# Patient Record
Sex: Female | Born: 1980 | Race: Black or African American | Hispanic: No | Marital: Single
Health system: Southern US, Community
[De-identification: ages and names within clinical notes are randomized; demographics above are authoritative.]

---

## 2014-03-26 ENCOUNTER — Inpatient Hospital Stay: Payer: Self-pay | Admitting: Surgery

## 2014-03-26 LAB — CBC
HCT: 45 % (ref 35.0–47.0)
HGB: 14.3 g/dL (ref 12.0–16.0)
MCH: 27.9 pg (ref 26.0–34.0)
MCHC: 31.8 g/dL — ABNORMAL LOW (ref 32.0–36.0)
MCV: 88 fL (ref 80–100)
PLATELETS: 405 10*3/uL (ref 150–440)
RBC: 5.13 10*6/uL (ref 3.80–5.20)
RDW: 13.7 % (ref 11.5–14.5)
WBC: 12.1 10*3/uL — ABNORMAL HIGH (ref 3.6–11.0)

## 2014-03-26 LAB — URINALYSIS, COMPLETE
BILIRUBIN, UR: NEGATIVE
Bacteria: NONE SEEN
GLUCOSE, UR: NEGATIVE mg/dL (ref 0–75)
KETONE: NEGATIVE
Leukocyte Esterase: NEGATIVE
Nitrite: NEGATIVE
PH: 8 (ref 4.5–8.0)
Protein: NEGATIVE
Specific Gravity: 1.003 (ref 1.003–1.030)
WBC UR: 2 /HPF (ref 0–5)

## 2014-03-26 LAB — COMPREHENSIVE METABOLIC PANEL
AST: 781 U/L — AB (ref 15–37)
Albumin: 3.8 g/dL (ref 3.4–5.0)
Alkaline Phosphatase: 165 U/L — ABNORMAL HIGH
Anion Gap: 5 — ABNORMAL LOW (ref 7–16)
BILIRUBIN TOTAL: 2.7 mg/dL — AB (ref 0.2–1.0)
BUN: 6 mg/dL — AB (ref 7–18)
CALCIUM: 9.4 mg/dL (ref 8.5–10.1)
CHLORIDE: 103 mmol/L (ref 98–107)
CO2: 26 mmol/L (ref 21–32)
Creatinine: 0.85 mg/dL (ref 0.60–1.30)
EGFR (African American): >60
EGFR (Non-African Amer.): >60
GLUCOSE: 138 mg/dL — AB (ref 65–99)
Osmolality: 268 (ref 275–301)
Potassium: 4.3 mmol/L (ref 3.5–5.1)
SGPT (ALT): 828 U/L — ABNORMAL HIGH (ref 12–78)
Sodium: 134 mmol/L — ABNORMAL LOW (ref 136–145)
Total Protein: 8.2 g/dL (ref 6.4–8.2)

## 2014-03-26 LAB — LIPASE, BLOOD

## 2014-03-27 LAB — COMPREHENSIVE METABOLIC PANEL
ALBUMIN: 2.9 g/dL — AB (ref 3.4–5.0)
ANION GAP: 6 — AB (ref 7–16)
Alkaline Phosphatase: 136 U/L — ABNORMAL HIGH
BUN: 7 mg/dL (ref 7–18)
Bilirubin,Total: 0.9 mg/dL (ref 0.2–1.0)
Calcium, Total: 7.9 mg/dL — ABNORMAL LOW (ref 8.5–10.1)
Chloride: 109 mmol/L — ABNORMAL HIGH (ref 98–107)
Co2: 26 mmol/L (ref 21–32)
Creatinine: 0.78 mg/dL (ref 0.60–1.30)
EGFR (African American): >60
EGFR (Non-African Amer.): >60
Glucose: 86 mg/dL (ref 65–99)
OSMOLALITY: 279 (ref 275–301)
Potassium: 3.7 mmol/L (ref 3.5–5.1)
SGOT(AST): 235 U/L — ABNORMAL HIGH (ref 15–37)
SGPT (ALT): 522 U/L — ABNORMAL HIGH (ref 12–78)
Sodium: 141 mmol/L (ref 136–145)
Total Protein: 6.4 g/dL (ref 6.4–8.2)

## 2014-03-27 LAB — CBC WITH DIFFERENTIAL/PLATELET
Basophil #: 0 10*3/uL (ref 0.0–0.1)
Basophil %: 0.3 %
Eosinophil #: 0.2 10*3/uL (ref 0.0–0.7)
Eosinophil %: 1.8 %
HCT: 36.4 % (ref 35.0–47.0)
HGB: 11.9 g/dL — ABNORMAL LOW (ref 12.0–16.0)
Lymphocyte #: 1.4 10*3/uL (ref 1.0–3.6)
Lymphocyte %: 14.7 %
MCH: 28.8 pg (ref 26.0–34.0)
MCHC: 32.8 g/dL (ref 32.0–36.0)
MCV: 88 fL (ref 80–100)
MONOS PCT: 8.6 %
Monocyte #: 0.8 x10 3/mm (ref 0.2–0.9)
NEUTROS ABS: 7.1 10*3/uL — AB (ref 1.4–6.5)
NEUTROS PCT: 74.6 %
PLATELETS: 333 10*3/uL (ref 150–440)
RBC: 4.14 10*6/uL (ref 3.80–5.20)
RDW: 14.1 % (ref 11.5–14.5)
WBC: 9.5 10*3/uL (ref 3.6–11.0)

## 2014-03-27 LAB — URINALYSIS, COMPLETE
BACTERIA: NONE SEEN
Bilirubin,UR: NEGATIVE
Blood: NEGATIVE
Glucose,UR: 150 mg/dL (ref 0–75)
Leukocyte Esterase: NEGATIVE
Nitrite: NEGATIVE
PROTEIN: NEGATIVE
Ph: 6 (ref 4.5–8.0)
RBC,UR: 1 /HPF (ref 0–5)
Specific Gravity: 1.014 (ref 1.003–1.030)
WBC UR: NONE SEEN /HPF (ref 0–5)

## 2014-03-27 LAB — PREGNANCY, URINE: Pregnancy Test, Urine: NEGATIVE m[IU]/mL

## 2014-03-27 LAB — HEMOGLOBIN A1C: HEMOGLOBIN A1C: 5.8 % (ref 4.2–6.3)

## 2014-03-27 LAB — LIPASE, BLOOD: Lipase: 3505 U/L — ABNORMAL HIGH (ref 73–393)

## 2014-03-28 LAB — COMPREHENSIVE METABOLIC PANEL
ANION GAP: 5 — AB (ref 7–16)
AST: 69 U/L — AB (ref 15–37)
Albumin: 2.6 g/dL — ABNORMAL LOW (ref 3.4–5.0)
Alkaline Phosphatase: 112 U/L
BUN: 4 mg/dL — ABNORMAL LOW (ref 7–18)
Bilirubin,Total: 0.4 mg/dL (ref 0.2–1.0)
CALCIUM: 7.2 mg/dL — AB (ref 8.5–10.1)
Chloride: 109 mmol/L — ABNORMAL HIGH (ref 98–107)
Co2: 26 mmol/L (ref 21–32)
Creatinine: 0.83 mg/dL (ref 0.60–1.30)
EGFR (African American): >60
EGFR (Non-African Amer.): >60
Glucose: 135 mg/dL — ABNORMAL HIGH (ref 65–99)
OSMOLALITY: 278 (ref 275–301)
Potassium: 3.3 mmol/L — ABNORMAL LOW (ref 3.5–5.1)
SGPT (ALT): 314 U/L — ABNORMAL HIGH (ref 12–78)
SODIUM: 140 mmol/L (ref 136–145)
Total Protein: 6.1 g/dL — ABNORMAL LOW (ref 6.4–8.2)

## 2014-03-28 LAB — MAGNESIUM: Magnesium: 1.7 mg/dL — ABNORMAL LOW

## 2014-03-28 LAB — LIPASE, BLOOD: Lipase: 607 U/L — ABNORMAL HIGH (ref 73–393)

## 2014-03-29 LAB — LIPASE, BLOOD: Lipase: 183 U/L (ref 73–393)

## 2014-04-01 LAB — CREATININE, SERUM
Creatinine: 0.82 mg/dL (ref 0.60–1.30)
EGFR (Non-African Amer.): >60

## 2014-04-02 LAB — PATHOLOGY REPORT

## 2015-04-17 NOTE — Consult Note (Signed)
Brief Consult Note: Diagnosis: biliary pancreatitis.   Patient was seen by consultant.   Consult note dictated.   Recommend to proceed with surgery or procedure.   Comments: Will likely need lap chole with cholangiograms once lipase normalizes. Will discuss with Dr Egbert GaribaldiBird in am. Rationale discussed with pt. rev'd neg MRCP results.  Electronic Signatures: Lattie Hawooper, Richard E (MD)  (Signed 02-Apr-15 20:45)  Authored: Brief Consult Note   Last Updated: 02-Apr-15 20:45 by Lattie Hawooper, Richard E (MD)

## 2015-04-17 NOTE — Consult Note (Signed)
PATIENT NAME:  Hannah Parker, Hannah Parker MR#:  [id number] DATE OF BIRTH:  07/25/1981  DATE OF CONSULTATION:  03/26/2014  REFERRING PHYSICIAN:     Brooke Bonito, MD  CONSULTING PHYSICIAN:  Hardie Shackleton. Colin Benton, PA-C  ATTENDING GASTROENTEROLOGIST:  Trixie Deis, MD  REASON FOR CONSULTATION: Gallstone pancreatitis.   HISTORY OF PRESENT ILLNESS: This is a pleasant 34 year old female who initially presented to the hospital with acute onset epigastric abdominal pain. There is mild amounts of nausea but no vomiting. No changes to her bowel habits. No fever or chills. No recent alcohol intake. Further work-up revealed elevated liver enzymes including a bilirubin of 2.7, AST 781, ALT of 828 and alkaline phosphatase 165. Follow up ultrasound of the abdomen revealed multiple gallstones with mildly thickened gallbladder wall. Common bile duct was 7.1 mm and her liver appeared normal. She was admitted with the diagnosis of gallstone pancreatitis. She is speaking with me this afternoon explaining she has been told she had gallstones in the past, but has never had lead to pancreatitis. She is still in quite a bit of abdominal pain at the current moment. She had been kept n.p.o., started on IV fluids and been given pain medications. No unintentional weight changes. No chest pain or shortness of breath.   PAST MEDICAL HISTORY: Gallstones, PCOS, obesity, diabetes mellitus type 2.   HOME MEDICATIONS: Metformin, Prilosec, spironolactone, losartan.   ALLERGIES: No known drug allergies.   SOCIAL HISTORY: The patient denies any tobacco or illicit drug use. She does report occasional social alcohol use, but denies anything in excess and denies any alcohol intake recently.   PAST SURGICAL HISTORY: None.   FAMILY HISTORY: No known family history of GI malignancy, colon polyps or IBD. She does report other family members have had gallbladder disease.   REVIEW OF SYSTEMS:  A 10 system review of systems was obtained on the patient.  Pertinent positives are mentioned above and otherwise negative.   PHYSICAL EXAMINATION:  VITAL SIGNS: Blood pressure 137/89, pulse 70, respirations 20, temperature 98.1, bedside pulse oximetry is 100%.   GENERAL: This is a pleasant 34 year old female resting quietly and comfortably in bed in no acute distress. Alert and oriented x 3.   HEAD: Atraumatic, normocephalic.   NECK: Supple. No lymphadenopathy noted.   HEENT: Sclerae anicteric. Mucous membranes moist.   PULMONARY: Respirations are even and unlabored. Clear to auscultation bilateral anterior lung fields.   CARDIAC: Regular rate and rhythm, S1, S2 noted.   ABDOMEN:  Soft and nondistended. Positive tenderness to even light palpation is noted in the epigastric region and across her whole upper abdomen. No guarding or rebound. No signs of an acute abdomen. Normoactive bowel sounds noted in all 4 quadrants. No masses, hernias, or organomegaly appreciated.   RECTAL: Deferred.   PSYCHIATRIC: Appropriate mood and affect.   NEUROLOGIC: Cranial nerves II-XII are grossly intact.   EXTREMITIES: Negative for lower extremity edema, 2+ pulses noted in bilateral upper extremities. Exam is somewhat limited secondary to obese habitus.   LABORATORY DATA: White blood cells 12.1, hemoglobin 14.3, hematocrit 45, platelets 405,000. Sodium 134, potassium 4.3, BUN 6, creatinine 0.85, glucose 138, bilirubin 2.7, AST 781, ALT 828, alkaline phosphatase 165. Lipase greater than 10,000.  IMAGING: An ultrasound of the abdomen was obtained on the patient showing multiple gallstones. Mildly thickened gallbladder wall and a common bile duct measuring 7.1 mm. Liver appeared normal.   ASSESSMENT:  1.  Elevated liver function tests.  2.  Markedly elevated lipase level suggesting a gallstone  pancreatitis.  3.  Abnormal ultrasound showing multiple gallstones, mildly thickened gallbladder wall and a common bile duct of 7.1 mm.  4.  Abdominal pain, likely  secondary to above.   PLAN: I have discussed this patient's case in detail with Dr. Trixie DeisMatthew Ryan who is involved in the development of the patient's plan of care. At the current moment, the overall clinical picture is suggestive of a gallstone-induced pancreatitis. Therefore, to rule out a retained stone or obstruction we would like to order a MRCP. Otherwise, we do recommend management of acute pancreatitis including IV fluids and pain control. We will check serial liver enzymes and keep a close eye on her lipase as well to see that these are trending down. She is also aware that once the acute bout of pancreatitis has settled she will likely be a candidate for laparoscopic cholecystectomy. She verbalized understanding and all questions were answered. We will continue to monitor this patient throughout hospitalization and make further recommendations pending the MRCP findings and per clinical course.   Thank you so much for this consultation and for allowing us to participate in the patient's plan of care.    ____________________________ Hardie ShackletonKaryn M. Jojo Pehl, PA-C kme:tc D: 03/26/2014 16:13:26 ET T: 03/26/2014 17:17:40 ET JOB#: [id number]  cc: Hardie ShackletonKaryn M. Yalissa Fink, PA-C, <Dictator> Hardie ShackletonKARYN M Amadou Katzenstein PA ELECTRONICALLY SIGNED 03/30/2014 11:28

## 2015-04-17 NOTE — Op Note (Signed)
PATIENT NAME:  Hannah Parker, Hannah Parker MR#:  [id number] DATE OF BIRTH:  Dec 05, 1981  DATE OF PROCEDURE:  03/30/2014  PREOPERATIVE DIAGNOSIS: Biliary pancreatitis.   POSTOPERATIVE DIAGNOSIS: Biliary pancreatitis.   PROCEDURE: Laparoscopic cholecystectomy with cholangiography.   SURGEON: Quentin Orealph Parker. Ely, MD  ANESTHESIA: General.   OPERATIVE PROCEDURE: With the patient in the supine position after the induction of appropriate general anesthesia the patient's abdomen was prepped with ChloraPrep and draped with sterile towels. The patient was placed in the head down, feet up position. A small infraumbilical incision was made in the standard fashion and carried down bluntly through the subcutaneous tissue. A Veress needle was used to cannulate the peritoneal cavity. CO2 was insufflated to appropriate pressure measurements. When approximately 2.5 liters of CO2 were instilled, the Veress needle was withdrawn and an 11 mm [employer] port was inserted into the peritoneal cavity. Peritoneal position was confirmed and CO2 was re-insufflated. The patient was placed in the head up, feet down position and rotated slightly to the left side. A subxiphoid transverse incision was made and an 11 mm port was inserted under direct vision. Two lateral ports, 5 mm in size, were inserted under direct vision. The gallbladder appeared shrunken, discolored, contracted, and thickened. It was grasped superiorly and laterally exposing the hepatoduodenal ligament. The cystic artery and cystic duct were identified. The cystic duct was clipped on the gallbladder side and opened. An on-table cholangiogram using dynamic fluoroscopy revealed free flow of dye into the duodenum. Intrahepatic radicles were seen. No obstruction was identified. The catheter was withdrawn. The cystic duct was doubly clipped on the common duct side and divided. The cystic artery was visualized, doubly clipped and divided. The gallbladder was then dissected free from its  bed and delivered using hook and cautery apparatus. Once the gallbladder was free, it was captured in an Endo Catch apparatus and removed through the subxiphoid incision. The area was copiously suctioned and irrigated. The upper subxiphoid incision was closed with 2 figure-of-eight sutures of 0 Vicryl under direct vision using the suture passer apparatus. The area was then desufflated. All ports were withdrawn without difficulty. The skin incision was closed with 5-0 nylon. The area was infiltrated with 0.25% Marcaine for postoperative pain control. Sterile dressings were applied. The patient was returned to the recovery room having tolerated the procedure well. Sponge, instrument and needle counts were correct x2 in the operating room.   ____________________________ Quentin Orealph Parker. Ely III, MD rle:sb D: 03/30/2014 14:56:49 ET T: 03/30/2014 15:08:39 ET JOB#: [id number]  cc: Quentin Orealph Parker. Ely III, MD, <Dictator> Quentin OreALPH Parker ELY MD ELECTRONICALLY SIGNED 03/31/2014 14:10

## 2015-04-17 NOTE — Consult Note (Signed)
PATIENT NAME:  Hannah Parker, Hannah Parker MR#:  [id number] DATE OF BIRTH:  1981-12-18  DATE OF CONSULTATION:  03/26/2014  REFERRING PHYSICIAN:   CONSULTING PHYSICIAN:  Adah Salvageichard E. Excell Seltzerooper, MD  CHIEF COMPLAINT: Abdominal pain.   HISTORY OF PRESENT ILLNESS: This is a patient with epigastric pain and a workup showing biliary pancreatitis. She was admitted earlier today with this diagnosis and I was asked to see the patient for consideration of surgical intervention once her pancreatitis resolves. The patient describes 1 day of abdominal pain and points to the epigastric area. She has had no back pain associated with it. She has had some nausea and vomiting. Her pain is better now than it was on admission, however, as she denies jaundice or acholic stools or dark urine.   PAST MEDICAL HISTORY: Morbid obesity, diabetes, and hypertension.   PAST SURGICAL HISTORY: Wisdom teeth.   MEDICATIONS: Multiple, see chart. See reconciliation sheet.   ALLERGIES: None.   FAMILY HISTORY: Gallstones.   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   REVIEW OF SYSTEMS: A 10-system review was performed and negative with the exception of that mentioned in the HPI.   PHYSICAL EXAMINATION:  GENERAL: Morbidly obese female patient with a BMI of 47, at 268 pounds.  VITAL SIGNS: Stable. She is afebrile.  HEENT: No scleral icterus.  NECK: No palpable neck nodes.  CHEST: Clear to auscultation.  CARDIAC: Regular rate and rhythm.  ABDOMEN: Obese, soft, minimally tender in the epigastrium and right upper quadrant with a negative Murphy's sign.  EXTREMITIES: Without edema.  NEUROLOGIC: Grossly intact.  INTEGUMENT: No jaundice.   DIAGNOSTIC STUDIES: Ultrasound shows stones dilated bile duct MRCP was performed. It showed no sign of choledocholithiasis.   Urinalysis shows 2+ blood. Electrolytes are within normal limits. Bilirubin is 2.7. AST and ALT 781 and 828 with a lipase of  over 10,000. White blood cell count of 12, H and H of 14 and 45.   ASSESSMENT AND PLAN: This is a patient with classic biliary tract disease and choledocholithiasis which has passed but she has biliary pancreatitis. I agree with the admitting physicians that observation at this point is warranted as her lipase and LFTs improved with negative MRCP. She can likely have a laparoscopic cholecystectomy later on in his course before she goes home. I will discuss this with Dr. Egbert GaribaldiBird, who will be taking care of her in the morning and I discussed the rationale for this approach with the patient and she was understanding of the plan.    ____________________________ Adah Salvageichard E. Excell Seltzerooper, MD rec:lt D: 03/26/2014 21:19:47 ET T: 03/26/2014 22:24:53 ET JOB#: [id number]  cc: Adah Salvageichard E. Excell Seltzerooper, MD, <Dictator> Lattie HawICHARD E COOPER MD ELECTRONICALLY SIGNED 03/27/2014 6:55

## 2015-04-17 NOTE — Consult Note (Signed)
Brief Consult Note: Diagnosis: epigastric pain, acute pancreatitis-likely GS induced.   Patient was seen by consultant.   Consult note dictated.   Orders entered.   Discussed with Attending MD.   Comments: Patient admitted with abdominal pain and markedly elevated Lipase. LFTs also high. US reflects multiple gallstones with a mildly thickened GB wall. CBD 7.801mm. Pain was acute onset. No n/v. No recent alcohol. No changes to her BMs. Still in quite a bit of pain during our discussion. MRCP is ordered to r/o retained stone. If evidence of choledocholithiasis, will likely need an ERCP.  CTM LFTs. Continue NPO and IVF for now. will follow closely and make further reccs pending MRCP.  Full consult dictated.  Electronic Signatures: Brantley StageEarle, Ariyanna Oien M (PA-C)  (Signed 02-Apr-15 16:06)  Authored: Brief Consult Note   Last Updated: 02-Apr-15 16:06 by Ashok CordiaEarle, Tashia Leiterman M (PA-C)

## 2015-04-17 NOTE — Consult Note (Signed)
Pt with gall stone pancreatitis. She if feeling better today with slight tenderness to deep palpation.  Lipase was 3500 yesterday and down to 607 today. SGPT down to 314, SGOT down to 69.  Creat good at 0.83.  VSS afeb.  Recommend gall bladder removal when surgeon is comfortable with timing.  Electronic Signatures: Scot JunElliott, Robert T (MD)  (Signed on 04-Apr-15 12:36)  Authored  Last Updated: 04-Apr-15 12:36 by Scot JunElliott, Robert T (MD)

## 2015-04-17 NOTE — Discharge Summary (Signed)
PATIENT NAME:  Hannah Parker, Hannah Parker MR#:  [id number] DATE OF BIRTH:  [birth year]  DATE OF ADMISSION:  03/26/2014 DATE OF DISCHARGE:  04/01/2014  BRIEF HISTORY: Ms. [name] is a 34 year old woman admitted through the Emergency Room with signs and symptoms consistent with biliary pancreatitis. She had significant abdominal midepigastric, right upper quadrant pain associated with mild nausea and vomiting. Admission lipase was over 10,000, bilirubin was 2.7. Transaminases were markedly elevated. White blood cell count was 12,000. She was seen by multiple services at that time. The GI service was involved with Dr. Dow AdolphMatthew Rein who suggested [name] was indicated. [name] did not demonstrate any evidence of ductal abnormality. Ultrasound did demonstrate multiple stones. Her lipase came down with to 3500 on 04/03,  600 on 04/04 and back to normal on 04/05. She was taken to surgery on the morning of 04/06 with general laparoscopic cholecystectomy. She did have significant biliary tract disease. Cholangiogram performed at that time did not demonstrate any evidence of common duct obstruction. The procedure was otherwise uncomplicated. She has had slow return of bowel function with mild nausea and pain control issues, but this morning is feeling better with no particular complaints. Her wounds look good. There is no sign of any infection.   DISCHARGE MEDICATIONS: Include Losartan 25 mg p.o. daily, metformin 1000 mg once a day, spironolactone 25 mg once a day, Prilosec 20 mg once a day, Percocet 5/325 every 4 to 6 hours p.r.n. pain.   FINAL DISCHARGE DIAGNOSIS: Biliary pancreatitis.   SURGERY: Laparoscopic cholecystectomy with cholangiography.  ____________________________ [name], MD rle:sg D: 04/01/2014 10:54:07 ET T: 04/01/2014 11:29:02 ET JOB#: [id number]  cc: [name], MD, <Dictator> Dow AdolphMatthew Rein, MD  Quentin OreALPH Parker ELY MD ELECTRONICALLY SIGNED 04/01/2014 16:56

## 2015-04-17 NOTE — Consult Note (Signed)
Pt doing well.  CC gallstone pancreatitis.  Minimal discomfort in lower abd. May be due to pancreatic enzymes trickling down gutters.  Lipase 183 today.  Plans for GB removal tomorrow.   Dr. Shelle Ironein to return tomorrow.  Electronic Signatures: Scot JunElliott, Robert T (MD)  (Signed on 05-Apr-15 12:19)  Authored  Last Updated: 05-Apr-15 12:19 by Scot JunElliott, Robert T (MD)

## 2015-04-17 NOTE — H&P (Signed)
PATIENT NAME:  Hannah Parker, Hannah Parker MR#:  [id number] DATE OF BIRTH:  03-07-1981  DATE OF ADMISSION:  03/26/2014  PRIMARY CARE PROVIDER: At [facility].   CHIEF COMPLAINT: Abdominal pain and nausea.   HISTORY OF PRESENT ILLNESS:  A 34 year old African American female patient with history of obesity, diabetes mellitus type 2, PCOS and prior diagnosis of gallstones presents to the Emergency Room with 1 day of abdominal pain, which is mostly in the epigastric, right upper quadrant and left upper quadrant areas. The patient had this pain yesterday. She initially thought this was pain from her GERD which she keeps getting on and off and she has not taken Prilosec in the past few days. She normally has this pain resolved with a bowel movement, but this did not improve today. The patient was going to work, had to stop at the side of the road secondary to worsening pain and presented to the Emergency Room. She has had significant nausea but no vomiting. The patient was diagnosed as having gallstones around 10 years back when they were working her up for abdominal pain and chest pain. This has not caused problems since.   Today in the Emergency Room, the patient has been found to have multiple gallstones with mildly dilated CBD at 7.1. No cholecystitis on ultrasound. Her liver enzymes are elevated with AST, ALT at 700, 800; alk phos at 185 and bilirubin 2.7. Lipase is greater than 10,000.   PAST MEDICAL HISTORY: 1.  PCOS.  2.  Obesity.  3.  Diabetes mellitus, type 2.   FAMILY HISTORY: Gallstones. No pancreatitis in the family.   HOME MEDICATIONS: 1.  Losartan 25 mg once a day. 2.  Metformin 500 mg oral twice a day.  3.  Prilosec 20 mg daily.  4.  Spironolactone 25 mg daily.   ALLERGIES: No known drug allergies.   SOCIAL HISTORY: The patient does not smoke, rare alcohol use. No illicit drugs.  CODE STATUS: Full code.   REVIEW OF SYSTEMS:  CONSTITUTIONAL: Complains of fatigue and weakness.  EYES:  No blurred vision, pain, redness.  EARS, NOSE, THROAT: No tinnitus, ear pain, hearing loss.  RESPIRATORY: No cough, wheeze, hemoptysis.  CARDIOVASCULAR: No chest pain, orthopnea, edema.  GASTROINTESTINAL: Has nausea but no vomiting. Has abdominal pain. No hematemesis, melena.  GENITOURINARY: No dysuria, hematuria or frequency.  ENDOCRINE: No polyuria, nocturia, thyroid problems. HEMATOLOGIC AND LYMPHATIC:  No anemia, easy bruising, bleeding.  INTEGUMENTARY: No acne, rash, lesion.  MUSCULOSKELETAL: No back pain, arthritis.  NEUROLOGIC: No focal numbness, weakness, seizure.  PSYCHIATRIC: No anxiety or depression.   PHYSICAL EXAMINATION: VITAL SIGNS: Shows temperature 98.5, pulse of 82, blood pressure 130/86, saturating 100% on room air.  GENERAL: Obese African American female patient lying in bed, seems comfortable, conversational, cooperative with exam.  PSYCHIATRIC: Alert and oriented x 3. Mood and affect appropriate. Judgment intact.  HEENT: Atraumatic, normocephalic. Oral mucosa dry and pink. External ears and nose normal. Pupils bilaterally equal and reactive to light. Icterus positive. No pallor.  NECK: Supple. No thyromegaly. No palpable lymph nodes. Trachea midline. No carotid bruit, JVD.  CARDIOVASCULAR: S1, S2, without any murmurs. Peripheral pulses 2+. No edema.  RESPIRATORY: Normal work of breathing. Clear to auscultation and percussion. GASTROINTESTINAL:  Soft abdomen. Tenderness in the epigastric left upper quadrant area, right upper quadrant area. Murphy sign is negative. Bowel sounds present. No rigidity, guarding. No hepatosplenomegaly palpable.  GENITOURINARY: No CVA tenderness or bladder distention.  SKIN: Warm and dry. No petechiae, rash,  ulcers.  MUSCULOSKELETAL: No joint swelling, redness, effusion of the large joints. Normal muscle tone.  NEUROLOGIC:  Motor strength 5/5 in upper extremities.  LYMPHATIC: No cervical lymphadenopathy.   LABORATORY DATA:  Show glucose  138, BUN 6, creatinine 0.85. Sodium 134, potassium 4.3, chloride 103, bicarb 26, GFR greater than 60. Lipase greater than 10,000.   Bilirubin of 2.7, alk phos 165, AST 781 and ALT 822.   WBC 12.1, hemoglobin 14.3, platelets of 405.  Urinalysis shows no bacteria.   Ultrasound of the right upper quadrant area shows multiple gallstones with a contracted gallbladder. Gallbladder was mildly thickened at 4.3.  Negative sonographic Murphy sign. Common bile upper normal at 7.1.   ASSESSMENT AND PLAN: 1.  Gallstone pancreatitis in a patient with history of gallstones. There is no cholecystitis and reviewing her liver function tests in absence of Murphy sign, I am concerned if she has common bile duct stone causing these symptoms. Hopefully, the stone passes. Her symptoms will improved quickly. Discussed with gastroenterology, Dr. Rayann Heman and will get an MRCP. If there is a blockage, the patient may need ERCP. Presently, she is afebrile. White count is normal. No concern for cholangitis. Will not start any antibiotics. Will also consult surgery as the patient will need cystectomy prior to discharge. The patient will be n.p.o. except medications, aggressive IV fluid resuscitation, pain control and monitor her blood work.  Discussed in detail with patient and her father at bedside and answered all questions.  2.  Diabetes mellitus type 2. The patient mentioned that she was started on metformin as her HbA1c was greater than 5. We will check HbA1c. Put her on sliding scale insulin.  3.  Possible hypertension. The patient mentioned that she was started on losartan as her blood pressure was in the 150s when it was checked once, but she believes she does not have hypertension. We will continue the losartan at this time. Monitor blood pressure closely.  4.  Polycystic ovary syndrome.  Continue medications.  5.  Deep vein thrombosis prophylaxis with Lovenox.   TIME SPENT TODAY ON THIS CASE:  45  minutes.    ____________________________ Leia Alf Raylene Carmickle, MD srs:ce D: 03/26/2014 14:10:59 ET T: 03/26/2014 14:57:05 ET JOB#: [id number]  cc: Alveta Heimlich R. Latese Dufault, MD, <Dictator> [place] MD ELECTRONICALLY SIGNED 03/26/2014 20:13

## 2015-04-17 NOTE — Consult Note (Signed)
Details:   - GI Note.  I have seen and examined Ms Hannah Parker and agree with Brantley StageKaryn Earle a/p.   Gallstone pancreatitis: doing well clinically.   Need MRCP to eval for continued CBD stone but suspect has cleared.  - Surgical eval for cholecystectomy.   Electronic Signatures: Dow Adolphein, Matthew (MD)  (Signed 02-Apr-15 18:00)  Authored: Details   Last Updated: 02-Apr-15 18:00 by Dow Adolphein, Matthew (MD)

## 2015-05-16 IMAGING — CR DG CHOLANGIOGRAM OPERATIVE
6 series · 15 of 43 positions shown · non-contrast
Comparison: None.

CLINICAL DATA: Cholelithiasis.

EXAM:
INTRAOPERATIVE CHOLANGIOGRAM
TECHNIQUE: Cholangiographic images from the C-arm fluoroscopic device were
submitted for interpretation post-operatively. Please see the
procedural report for the amount of contrast and the fluoroscopy
time utilized.

[cont. (1 of 6)]
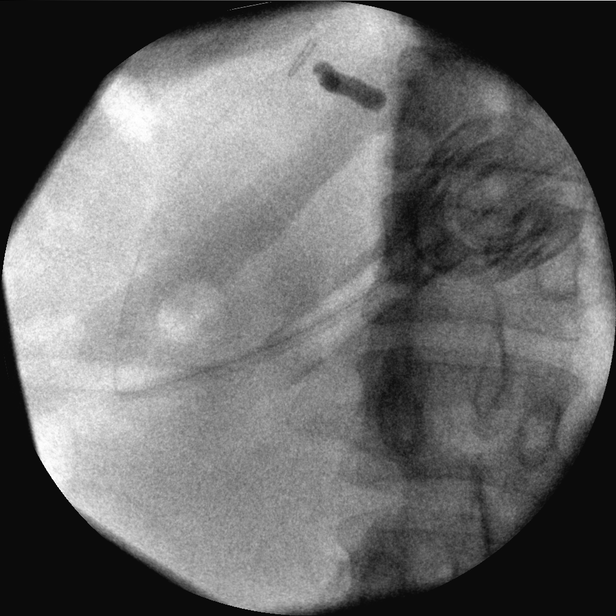

[Series 2: cont. · 2 of 4 frames shown (2 of 6)]
[frame 1/4]
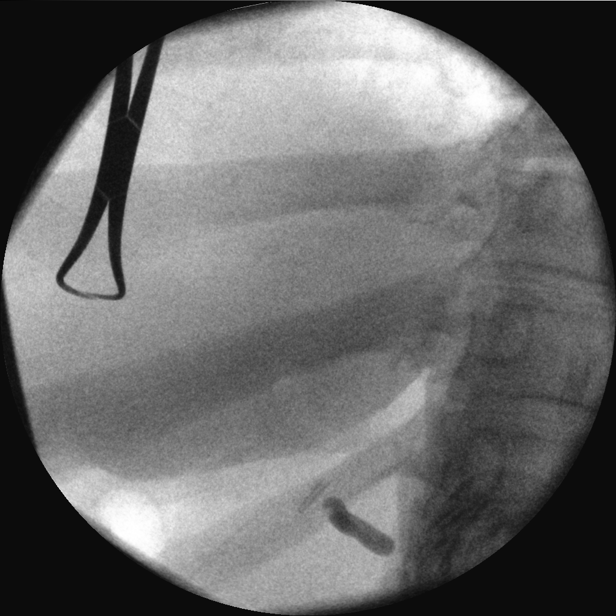
[frame 4/4]
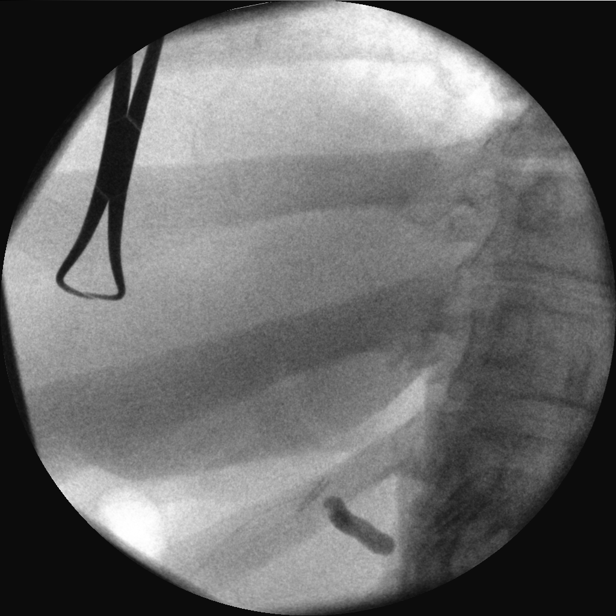

[Series 3: cont. · 2 of 5 frames shown (3 of 6)]
[frame 3/5]
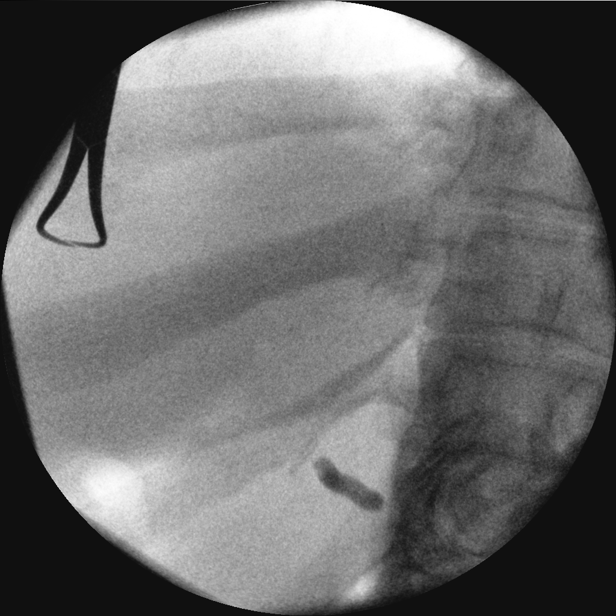
[frame 5/5]
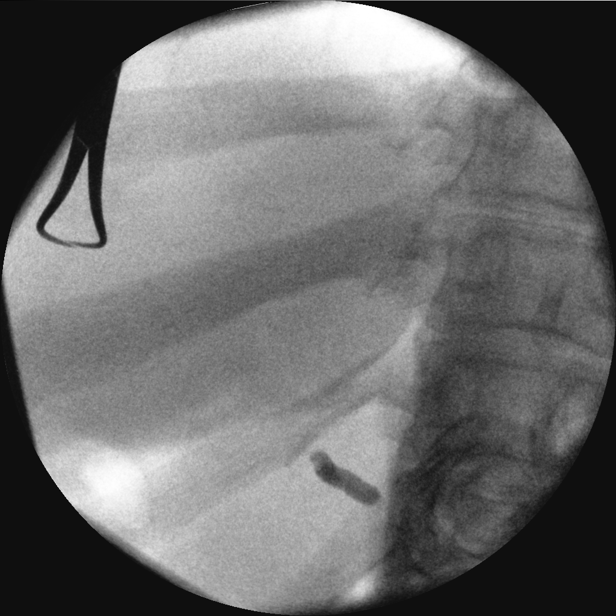

[Series 4: cont. · 3 of 12 frames shown (4 of 6)]
[frame 3/12]
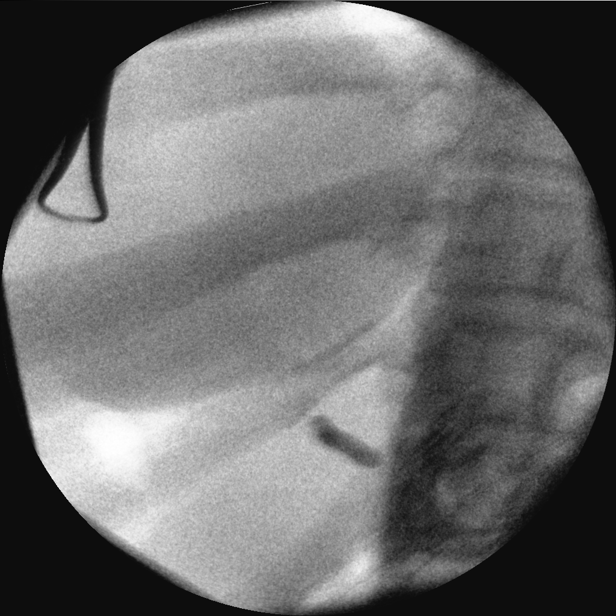
[frame 7/12]
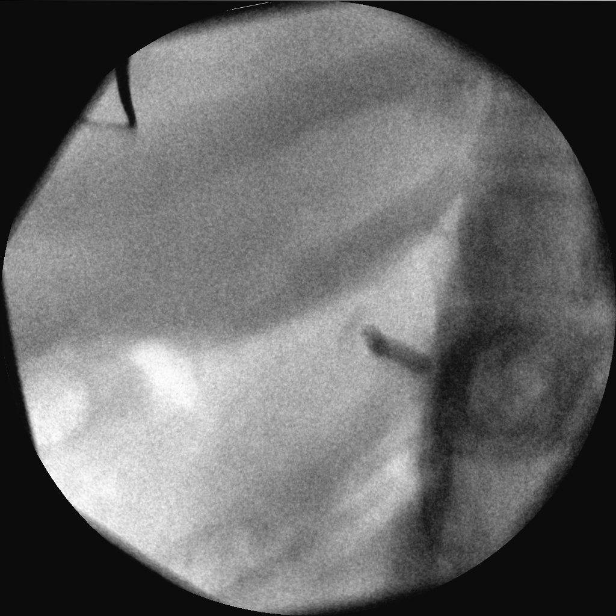
[frame 10/12]
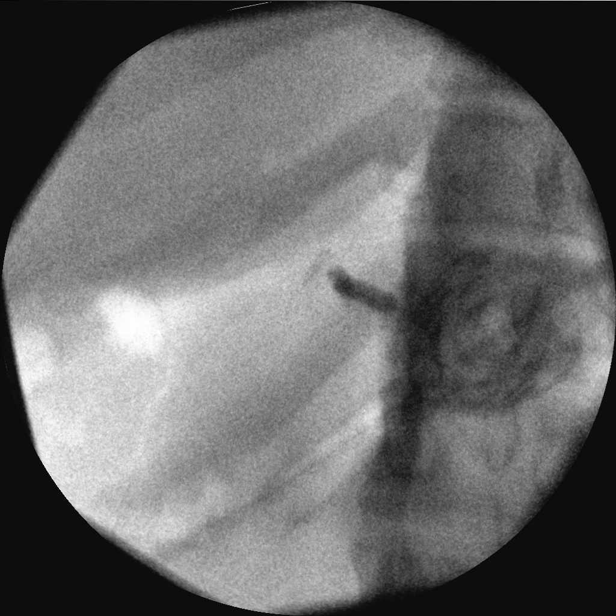

[Series 5: cont. · 4 of 73 frames shown (5 of 6)]
[frame 9/73]
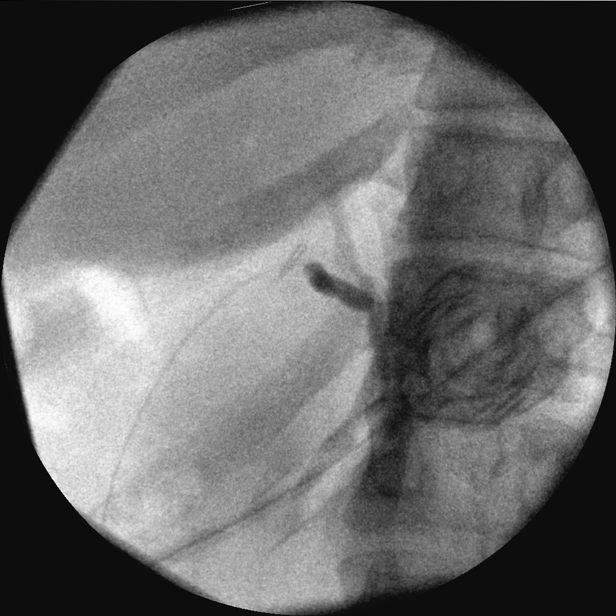
[frame 33/73]
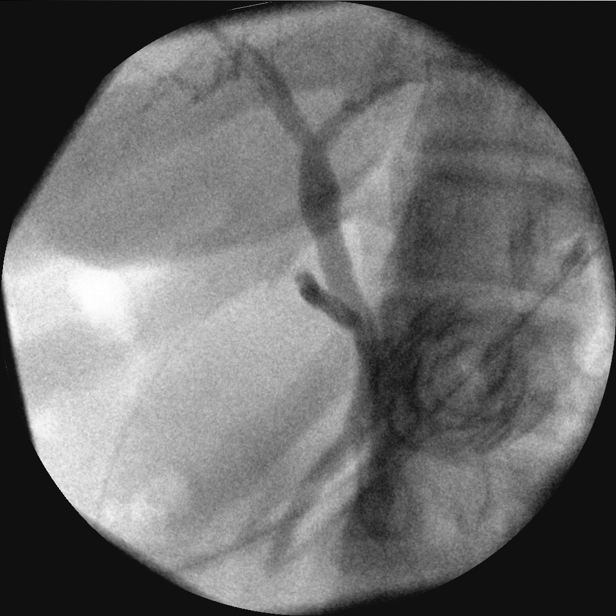
[frame 57/73]
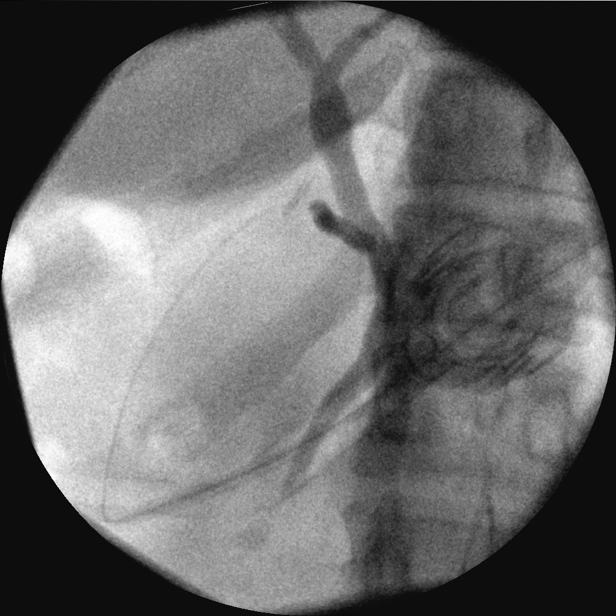
[frame 73/73]
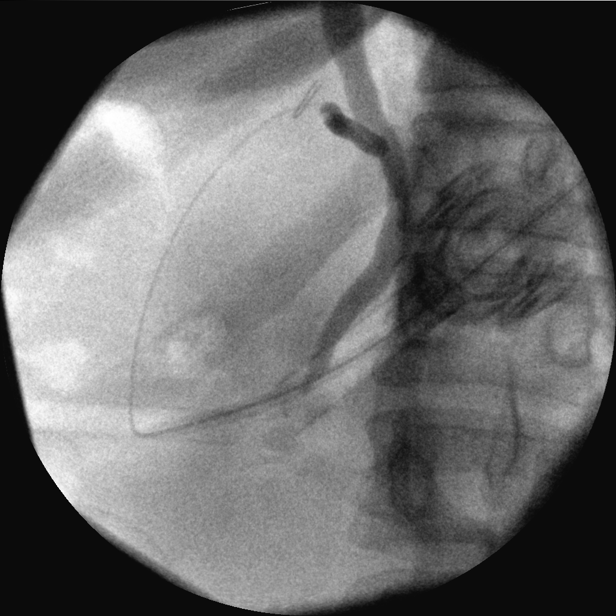

[Series 6: cont. · 3 of 26 frames shown (6 of 6)]
[frame 6/26]
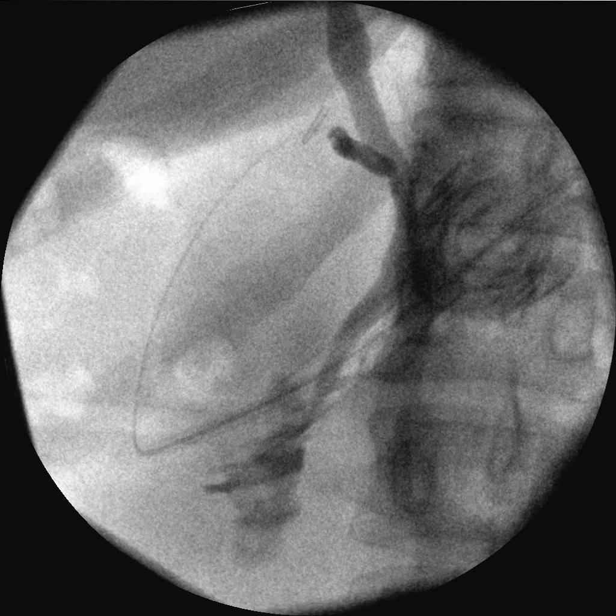
[frame 14/26]
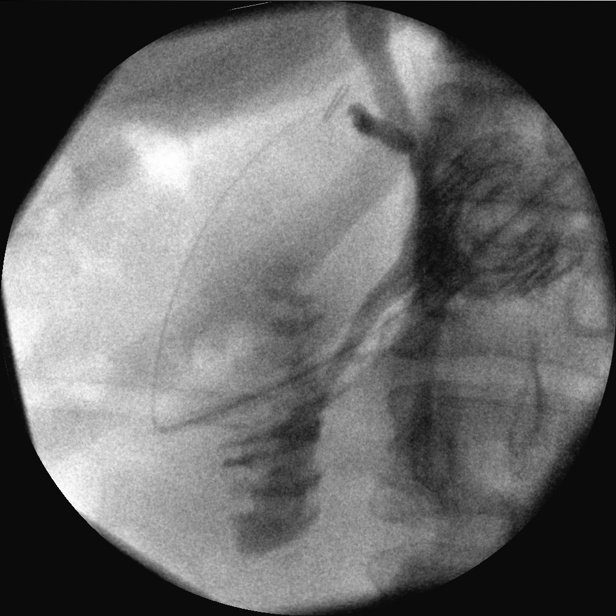
[frame 23/26]
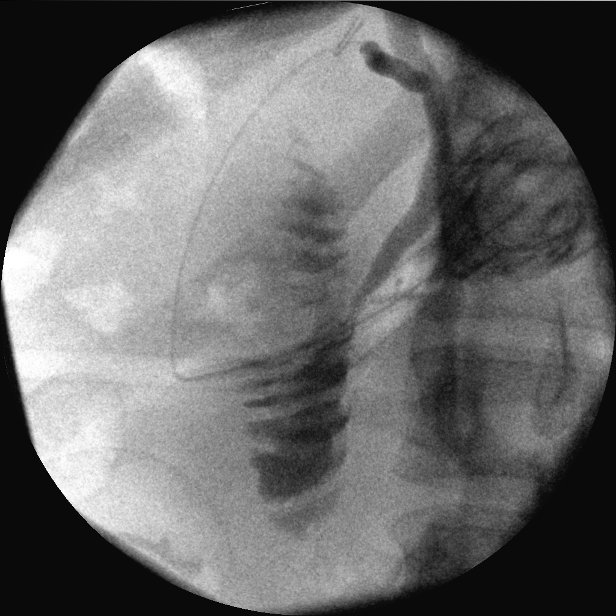

[15 of 43 positions shown; findings below may reference images not displayed]

FINDINGS: Multiple intraoperative C-arm exposures were performed. There is
some type of tubular radiodense structure always associated with the
cystic duct, possible balloon catheter filled with contrast.

The common hepatic duct and common bile duct are of normal caliber.
No filling defects are seen. There is passage of contrast into the
duodenum. Visualized intrahepatic bile ducts appear unremarkable.
IMPRESSION: Negative operative cholangiogram.  See discussion above.

## 2020-03-04 ENCOUNTER — Ambulatory Visit: Payer: Self-pay | Attending: Family

## 2020-03-04 DIAGNOSIS — Z23 Encounter for immunization: Secondary | ICD-10-CM

## 2020-03-04 NOTE — Progress Notes (Signed)
   Covid-19 Vaccination [facility]  Name:  Hannah Parker    MRN: [id number] DOB: [birth year]  03/04/2020  [name] was observed post Covid-19 immunization for 15 minutes without incident. She was provided with Vaccine Information Sheet and instruction to access the V-Safe system.   [name] was instructed to call 911 with any severe reactions post vaccine: Marland Kitchen Difficulty breathing  . Swelling of face and throat  . A fast heartbeat  . A bad rash all over body  . Dizziness and weakness   Immunizations Administered    Name Date Dose VIS Date Route   Moderna COVID-19 Vaccine 03/04/2020 12:06 PM 0.5 mL 11/25/2019 Intramuscular   Manufacturer: Moderna   Lot: [id number]   NDC: 47425-956-38

## 2020-04-06 ENCOUNTER — Ambulatory Visit: Payer: Self-pay | Attending: Family

## 2020-04-06 DIAGNOSIS — Z23 Encounter for immunization: Secondary | ICD-10-CM

## 2020-04-06 NOTE — Progress Notes (Signed)
   Covid-19 Vaccination Clinic  Name:  Hannah Parker    MRN: [id number] DOB: 24-Feb-1981  04/06/2020  [name] was observed post Covid-19 immunization for 15 minutes without incident. She was provided with Vaccine Information Sheet and instruction to access the V-Safe system.   [name] was instructed to call 911 with any severe reactions post vaccine: Marland Kitchen Difficulty breathing  . Swelling of face and throat  . A fast heartbeat  . A bad rash all over body  . Dizziness and weakness   Immunizations Administered    Name Date Dose VIS Date Route   Moderna COVID-19 Vaccine 04/06/2020 10:09 AM 0.5 mL 11/25/2019 Intramuscular   Manufacturer: Moderna   Lot: [id number]   NDC: 49447-395-84

## 2020-11-16 ENCOUNTER — Ambulatory Visit: Payer: Self-pay

## 2020-11-22 ENCOUNTER — Ambulatory Visit: Payer: Self-pay | Attending: Internal Medicine

## 2020-11-22 DIAGNOSIS — Z23 Encounter for immunization: Secondary | ICD-10-CM

## 2020-11-22 NOTE — Progress Notes (Signed)
   Covid-19 Vaccination Clinic  Name:  CHIZUKO TRINE    MRN: [id number] DOB: 11/09/1981  11/22/2020  Ms. Sui was observed post Covid-19 immunization for 15 minutes without incident. She was provided with Vaccine Information Sheet and instruction to access the V-Safe system.   Ms. Mendell was instructed to call 911 with any severe reactions post vaccine: Marland Kitchen Difficulty breathing  . Swelling of face and throat  . A fast heartbeat  . A bad rash all over body  . Dizziness and weakness   Immunizations Administered    No immunizations on file.

## 2022-11-14 ENCOUNTER — Other Ambulatory Visit: Payer: Self-pay | Admitting: Sports Medicine

## 2022-11-14 DIAGNOSIS — W108XXA Fall (on) (from) other stairs and steps, initial encounter: Secondary | ICD-10-CM

## 2022-11-14 DIAGNOSIS — M25562 Pain in left knee: Secondary | ICD-10-CM

## 2022-11-15 ENCOUNTER — Other Ambulatory Visit: Payer: Self-pay | Admitting: Sports Medicine

## 2022-11-15 DIAGNOSIS — M25562 Pain in left knee: Secondary | ICD-10-CM

## 2022-11-15 DIAGNOSIS — W108XXA Fall (on) (from) other stairs and steps, initial encounter: Secondary | ICD-10-CM

## 2022-11-30 ENCOUNTER — Ambulatory Visit: Admission: RE | Admit: 2022-11-30 | Payer: BC Managed Care – PPO | Source: Ambulatory Visit
